# Patient Record
Sex: Male | Born: 1950 | Race: White | Hispanic: No | Marital: Married | Smoking: Never smoker
Health system: Southern US, Community
[De-identification: ages and names within clinical notes are randomized; demographics above are authoritative.]

## PROBLEM LIST (undated history)

## (undated) DIAGNOSIS — Z1211 Encounter for screening for malignant neoplasm of colon: Secondary | ICD-10-CM

## (undated) DIAGNOSIS — C61 Malignant neoplasm of prostate: Secondary | ICD-10-CM

## (undated) DIAGNOSIS — R55 Syncope and collapse: Secondary | ICD-10-CM

## (undated) DIAGNOSIS — R42 Dizziness and giddiness: Secondary | ICD-10-CM

## (undated) DIAGNOSIS — I10 Essential (primary) hypertension: Secondary | ICD-10-CM

## (undated) DIAGNOSIS — N200 Calculus of kidney: Secondary | ICD-10-CM

## (undated) DIAGNOSIS — K449 Diaphragmatic hernia without obstruction or gangrene: Secondary | ICD-10-CM

## (undated) DIAGNOSIS — J310 Chronic rhinitis: Secondary | ICD-10-CM

## (undated) DIAGNOSIS — Z86010 Personal history of colonic polyps: Secondary | ICD-10-CM

## (undated) DIAGNOSIS — K219 Gastro-esophageal reflux disease without esophagitis: Secondary | ICD-10-CM

## (undated) DIAGNOSIS — Z860101 Personal history of adenomatous and serrated colon polyps: Secondary | ICD-10-CM

## (undated) HISTORY — DX: Calculus of kidney: N20.0

## (undated) HISTORY — DX: Syncope and collapse: R55

## (undated) HISTORY — DX: Gastro-esophageal reflux disease without esophagitis: K21.9

---

## 2004-08-27 DIAGNOSIS — J310 Chronic rhinitis: Secondary | ICD-10-CM | POA: Insufficient documentation

## 2009-08-17 DIAGNOSIS — K449 Diaphragmatic hernia without obstruction or gangrene: Secondary | ICD-10-CM | POA: Insufficient documentation

## 2009-09-19 HISTORY — PX: COLONOSCOPY: SHX174

## 2013-06-19 DIAGNOSIS — Z8619 Personal history of other infectious and parasitic diseases: Secondary | ICD-10-CM | POA: Insufficient documentation

## 2014-06-24 DIAGNOSIS — K219 Gastro-esophageal reflux disease without esophagitis: Secondary | ICD-10-CM | POA: Insufficient documentation

## 2014-11-02 ENCOUNTER — Encounter: Payer: Self-pay | Admitting: Radiation Oncology

## 2014-11-02 DIAGNOSIS — C61 Malignant neoplasm of prostate: Secondary | ICD-10-CM | POA: Insufficient documentation

## 2014-11-02 NOTE — Progress Notes (Signed)
Radiation Oncology         [phone]) [phone] ________________________________  Initial outpatient Consultation  Name: Ernest Campos MRN: [id number]  Date: 11/03/2014  DOB: 02/08/1951  CC:No primary care provider on file.  Raynelle Bring, MD   REFERRING PHYSICIAN: Raynelle Bring, MD  DIAGNOSIS: 64 y.o. gentleman with stage T1c adenocarcinoma of the prostate with a Gleason's score of 3+4 and a PSA of 6.72    ICD-9-CM ICD-10-CM   1. Malignant neoplasm of prostate Ernest Campos is a 63 y.o. gentleman.  He was noted to have an elevated PSA of 4.82 on 06/26/14 by his primary care physician, Dr. Lebron Conners in [place].  Accordingly, he was referred for evaluation in urology by Dr. Alinda Money on 09/24/14,  digital rectal examination was performed at that time revealing a 60 gm with no nodules.  Repeat PSA was 6.72.  The patient proceeded to transrectal ultrasound with 12 biopsies of the prostate on 10/14/14.  The prostate volume measured 34 cc.  Out of 12 core biopsies, 5 were positive.  The maximum Gleason score was 3+4, and this was seen in the right lateral base.  The patient reviewed the biopsy results with his urologist and he has kindly been referred today for discussion of potential radiation treatment options.  PREVIOUS RADIATION THERAPY: No  PAST MEDICAL HISTORY:  has a past medical history of Hypertension; Hiatal hernia; Encounter for colonoscopy due to history of adenomatous colonic polyps; Chronic rhinitis; and Prostate cancer.    PAST SURGICAL HISTORY: Past Surgical History  Procedure Laterality Date  . Colonoscopy  2011    FAMILY HISTORY: family history includes Cancer in his mother.  SOCIAL HISTORY:  reports that he has never smoked. He does not have any smokeless tobacco history on file. He reports that he drinks alcohol.  ALLERGIES: Review of patient's allergies indicates no known allergies.  MEDICATIONS:  Current Outpatient  Prescriptions  Medication Sig Dispense Refill  . esomeprazole (NEXIUM) 40 MG capsule Take 40 mg by mouth daily.    . valsartan-hydrochlorothiazide (DIOVAN-HCT) 80-12.5 MG per tablet Take 1 tablet by mouth daily.    . meclizine (ANTIVERT) 25 MG tablet Take 25 mg by mouth every 6 (six) hours as needed.     No current facility-administered medications for this encounter.    REVIEW OF SYSTEMS:  A 15 point review of systems is documented in the electronic medical record. This was obtained by the nursing staff. However, I reviewed this with the patient to discuss relevant findings and make appropriate changes.  A comprehensive review of systems was negative..  The patient completed an IPSS and IIEF questionnaire.  His IPSS score was low indicating mild urinary outflow obstructive symptoms.  He indicated that his erectile function is able to complete sexual activity on most attempts.   PHYSICAL EXAM: This patient is in no acute distress.  He is alert and oriented.   weight is 222 lb 3.2 oz (100.789 kg). His blood pressure is 159/89 and his pulse is 69. His respiration is 16.  He exhibits no respiratory distress or labored breathing.  He appears neurologically intact.  His mood is pleasant.  His affect is appropriate.  Please note the digital rectal exam findings described above.  KPS = 100  100 - Normal; no complaints; no evidence of disease. 90   - Able to carry on normal activity; minor signs or symptoms of disease. 80   - Normal activity with  effort; some signs or symptoms of disease. 12   - Cares for self; unable to carry on normal activity or to do active work. 60   - Requires occasional assistance, but is able to care for most of his personal needs. 50   - Requires considerable assistance and frequent medical care. 16   - Disabled; requires special care and assistance. 19   - Severely disabled; hospital admission is indicated although death not imminent. 64   - Very sick; hospital admission  necessary; active supportive treatment necessary. 10   - Moribund; fatal processes progressing rapidly. 0     - Dead  Karnofsky DA, Abelmann WH, Craver LS and Burchenal JH [phone]) The use of the nitrogen mustards in the palliative treatment of carcinoma: with particular reference to bronchogenic carcinoma Cancer 1 [phone]   LABORATORY DATA:  No results found for: WBC, HGB, HCT, MCV, PLT No results found for: NA, K, CL, CO2 No results found for: ALT, AST, GGT, ALKPHOS, BILITOT   RADIOGRAPHY: No results found.    IMPRESSION: This gentleman is a 64 y.o. gentleman with stage T1c adenocarcinoma of the prostate with a Gleason's score of 3+4 and a PSA of 6.72.  His T-Stage, Gleason's Score, and PSA put him into the intermediate risk group.  Accordingly he is eligible for a variety of potential treatment options including radical prostatectomy, external beam radiotherapy, or he would potentially be a good candidate for prostate seed implant given the low volume of Gleason 7 with primary grade 3.  PLAN:Today I reviewed the findings and workup thus far.  We discussed the natural history of prostate cancer.  We reviewed the the implications of T-stage, Gleason's Score, and PSA on decision-making and outcomes related to prostate cancer.  We discussed radiation treatment in the management of prostate cancer with regard to the logistics and delivery of external beam radiation treatment as well as the logistics and delivery of prostate brachytherapy.  We compared and contrasted each of these approaches and also compared these against prostatectomy.    The patient focused most of his questions and interest in robotic-assisted laparoscopic radical prostatectomy.  We discussed some of the potential advantages of surgery including surgical staging, the availability of salvage radiotherapy to the prostatic fossa, and the confidence associated with immediate biochemical response.  We discussed some of the potential  proven indications for postoperative radiotherapy including positive margins, extracapsular extension, and seminal vesicle involvement. We also talked about some of the other potential findings leading to a recommendation for radiotherapy including a non-zero postoperative PSA and positive lymph nodes.   The patient would like to proceed with prostatectomy. I enjoyed meeting with him today, and will look forward to participating in the care of this very nice gentleman.  I will look forward to following his progress   I spent 60 minutes minutes face to face with the patient and more than 50% of that time was spent in counseling and/or coordination of care.      ------------------------------------------------  Sheral Apley. Tammi Klippel, M.D.

## 2014-11-03 ENCOUNTER — Encounter: Payer: Self-pay | Admitting: Radiation Oncology

## 2014-11-03 ENCOUNTER — Ambulatory Visit
Admission: RE | Admit: 2014-11-03 | Discharge: 2014-11-03 | Disposition: A | Discharge status: Home / Self Care | Payer: BLUE CROSS/BLUE SHIELD | Source: Ambulatory Visit | Attending: Radiation Oncology | Admitting: Radiation Oncology

## 2014-11-03 VITALS — BP 159/89 | HR 69 | Resp 16 | Wt 222.2 lb

## 2014-11-03 DIAGNOSIS — C61 Malignant neoplasm of prostate: Secondary | ICD-10-CM | POA: Diagnosis present

## 2014-11-03 DIAGNOSIS — Z86010 Personal history of colonic polyps: Secondary | ICD-10-CM | POA: Diagnosis not present

## 2014-11-03 DIAGNOSIS — I10 Essential (primary) hypertension: Secondary | ICD-10-CM | POA: Insufficient documentation

## 2014-11-03 DIAGNOSIS — Z79899 Other long term (current) drug therapy: Secondary | ICD-10-CM | POA: Insufficient documentation

## 2014-11-03 HISTORY — DX: Malignant neoplasm of prostate: C61

## 2014-11-03 HISTORY — DX: Essential (primary) hypertension: I10

## 2014-11-03 HISTORY — DX: Encounter for screening for malignant neoplasm of colon: Z12.11

## 2014-11-03 HISTORY — DX: Chronic rhinitis: J31.0

## 2014-11-03 HISTORY — DX: Diaphragmatic hernia without obstruction or gangrene: K44.9

## 2014-11-03 HISTORY — DX: Personal history of colonic polyps: Z86.010

## 2014-11-03 HISTORY — DX: Personal history of adenomatous and serrated colon polyps: Z86.0101

## 2014-11-03 NOTE — Progress Notes (Addendum)
GU Location of Tumor / Histology: prostatic adenocarcinoma  If Prostate Cancer, Gleason Score is (3 + 4) and PSA is (6.72)  Ernest Campos referred to Dr. Alinda Money by Dr. Lebron Conners for elevated PSA.  Biopsies of prostate (if applicable) revealed:    Past/Anticipated interventions by urology, if any: biopsy, referral, discussed prostatectomy  Past/Anticipated interventions by medical oncology, if any: no  Weight changes, if any: no  Bowel/Bladder complaints, if any: reports occasional hematuria since biopsy, describes a strong and steady urine stream, denies difficulty emptying, reports nocturia x 1 (however, he does report that he doesn't wake due to the need to void), denies dysuria, denies urgency.  Nausea/Vomiting, if any: no  Pain issues, if any: long standing intermittent low back pain; reports low back "went out about two weeks ago."  SAFETY ISSUES:  Prior radiation? no  Pacemaker/ICD? Penton  Possible current pregnancy? no  Is the patient on methotrexate? no  Current Complaints / other details:  64 year old male. Married. Prostate volume 34 cc.

## 2014-11-03 NOTE — Progress Notes (Signed)
See progress note under physician encounter. 

## 2014-11-06 ENCOUNTER — Other Ambulatory Visit: Payer: Self-pay | Admitting: Urology

## 2014-11-12 ENCOUNTER — Telehealth: Payer: Self-pay | Admitting: Cardiovascular Disease

## 2014-11-12 NOTE — Telephone Encounter (Signed)
Patient has consult with Dr. Acie Fredrickson for surgical clearance for prostate surgery on March 1. Left voicemail (patient identified voicemail) for patient that we are not able to anticipate and order and schedule tests prior to him seeing Dr. Acie Fredrickson Advised patient to call back with any other concerns.

## 2014-11-12 NOTE — Telephone Encounter (Signed)
New Msg       Pt is a new pt, calling to see what type of test will be completed.   Pt lives very far away and would like to have as much completed in one day as possible.    Please return pt call.

## 2014-11-18 ENCOUNTER — Encounter: Payer: Self-pay | Admitting: Cardiovascular Disease

## 2014-11-18 ENCOUNTER — Ambulatory Visit (INDEPENDENT_AMBULATORY_CARE_PROVIDER_SITE_OTHER): Payer: BLUE CROSS/BLUE SHIELD | Admitting: Cardiovascular Disease

## 2014-11-18 ENCOUNTER — Ambulatory Visit (HOSPITAL_COMMUNITY): Payer: BLUE CROSS/BLUE SHIELD | Attending: Cardiology | Admitting: Radiology

## 2014-11-18 VITALS — BP 132/76 | HR 75 | Ht 70.0 in | Wt 219.0 lb

## 2014-11-18 DIAGNOSIS — I10 Essential (primary) hypertension: Secondary | ICD-10-CM

## 2014-11-18 DIAGNOSIS — R55 Syncope and collapse: Secondary | ICD-10-CM | POA: Insufficient documentation

## 2014-11-18 NOTE — Progress Notes (Signed)
Cardiology Office Note   Date:  11/18/2014   ID:  Daymon Larsen, DOB 27-Jun-1951, MRN [id number]  PCP:  No primary care provider on file.  Cardiologist:   Gerado Nabers, Wonda Cheng, MD   Chief Complaint  Patient presents with  . Hypertension    pre-op evaluatino   Problem list 1. Hypertension 2. History of syncope 3.   History of Present Illness: Ernest Campos is a 64 y.o. male who is referred by Dr. Alinda Money ( primary is Lebron Conners in [place], [place]) presents for pre op evaluation prior to prostate surgery.  He had a prostate Bx in Jan.  He had a syncopal episode aprox 35 minutes after the bx.  He was out for about 20-30 seconds.  He was sitting down at the time.  Passed out.  They went home and felt fine after that.     He was not in any pain - slight discomfort at the bx site.  No additional complications following  No CP, no dyspnea. He walks on occasion.   Has not done much during the winter months. 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    He has had vasovagle episodes in the past.  Several times after giving blood.     Past Medical History  Diagnosis Date  . Hypertension   . Hiatal hernia   . Encounter for colonoscopy due to history of adenomatous colonic polyps   . Chronic rhinitis   . Prostate cancer   . Syncopal episodes   . Esophageal reflux   . Renal calculi     Past Surgical History  Procedure Laterality Date  . Colonoscopy  2011     Current Outpatient Prescriptions  Medication Sig Dispense Refill  . esomeprazole (NEXIUM) 40 MG capsule Take 40 mg by mouth daily.    . valsartan-hydrochlorothiazide (DIOVAN-HCT) 80-12.5 MG per tablet Take 1 tablet by mouth daily.     No current facility-administered medications for this visit.    Allergies:   Review of patient's allergies indicates no known allergies.    Social History:  The patient  reports that he has never smoked. He does not have any smokeless tobacco history on file. He reports that he drinks  alcohol.   Family History:  The patient's family history includes Cancer in his father; Hypertension in his father. There is no history of Heart attack or Stroke.    ROS:  Please see the history of present illness.    Review of Systems: Constitutional:  denies fever, chills, diaphoresis, appetite change and fatigue.  HEENT: denies photophobia, eye pain, redness, hearing loss, ear pain, congestion, sore throat, rhinorrhea, sneezing, neck pain, neck stiffness and tinnitus.  Respiratory: denies SOB, DOE, cough, chest tightness, and wheezing.  Cardiovascular: denies chest pain, palpitations and leg swelling.  Gastrointestinal: denies nausea, vomiting, abdominal pain, diarrhea, constipation, blood in stool.  Genitourinary: denies dysuria, urgency, frequency, hematuria, flank pain and difficulty urinating.  Musculoskeletal: denies  myalgias, back pain, joint swelling, arthralgias and gait problem.   Skin: denies pallor, rash and wound.  Neurological: denies dizziness, seizures, syncope, weakness, light-headedness, numbness and headaches.   Hematological: denies adenopathy, easy bruising, personal or family bleeding history.  Psychiatric/ Behavioral: denies suicidal ideation, mood changes, confusion, nervousness, sleep disturbance and agitation.       All other systems are reviewed and negative.    PHYSICAL EXAM: VS:  BP 132/76 mmHg  Pulse 75  Ht 5\' 10"  (1.778 m)  Wt 219 lb (99.338 kg)  BMI 31.42 kg/m2 , BMI Body mass index is 31.42 kg/(m^2). GEN: Well nourished, well developed, in no acute distress HEENT: normal Neck: no JVD, carotid bruits, or masses Cardiac: RRR; no murmurs, rubs, or gallops,no edema  Respiratory:  clear to auscultation bilaterally, normal work of breathing GI: soft, nontender, nondistended, + BS MS: no deformity or atrophy Skin: warm and dry, no rash Neuro:  Strength and sensation are intact Psych: normal   EKG:  EKG is ordered today. The ekg ordered today  demonstrates NSR at 75. Sinus arrhythmia.  Normal eCG    Recent Labs: No results found for requested labs within last 365 days.    Lipid Panel No results found for: CHOL, TRIG, HDL, CHOLHDL, VLDL, LDLCALC, LDLDIRECT    Wt Readings from Last 3 Encounters:  11/18/14 219 lb (99.338 kg)      Other studies Reviewed: Additional studies/ records that were reviewed today include: . Review of the above records demonstrates:    ASSESSMENT AND PLAN:  1.  Syncope:  He admits to having vasovagal episodes in the past. Every one of these episodes has occurred during this time when he was on depleted.   This last episode occurred 35 minutes after his prostate biopsy but he had been nothing by mouth since midnight the night before.  I do not think that it is sounds like ischemic heart disease. He's able to exercise without any significant problems. In addition, he typically does not have any Issues when he eats and drink normally.  We will get an echocardiogram for further evaluation of his cardiac function. I've encouraged him to stay hydrated. I would treat him aggressively with IV fluids during his prostate surgery.  I'll see him on an as-needed basis.  2. Hypertension: Continue same medications. He will follow-up with his general medical doctor.   Current medicines are reviewed at length with the patient today.  The patient does not have concerns regarding medicines.  The following changes have been made:  no change   Disposition:   FU with me as needed.    Signed, Zamira Hickam, Wonda Cheng, MD  11/18/2014 8:38 AM    [facility] [place], [place], [place]  [address] Phone: [phone]; Fax: [phone]

## 2014-11-18 NOTE — Patient Instructions (Signed)
Your physician recommends that you continue on your current medications as directed. Please refer to the Current Medication list given to you today.  Your physician has requested that you have an echocardiogram. Echocardiography is a painless test that uses sound waves to create images of your heart. It provides your doctor with information about the size and shape of your heart and how well your heart's chambers and valves are working. This procedure takes approximately one hour. There are no restrictions for this procedure.  Your physician recommends that you schedule a follow-up appointment in: as needed with Dr. Acie Fredrickson

## 2014-11-18 NOTE — Progress Notes (Signed)
Echocardiogram performed.  

## 2014-11-19 ENCOUNTER — Telehealth: Payer: Self-pay | Admitting: Cardiovascular Disease

## 2014-11-19 NOTE — Telephone Encounter (Signed)
Reviewed results of echocardiogram with patient who verbalized understanding and thanked me for the great care he received from everyone at [facility].

## 2014-11-19 NOTE — Telephone Encounter (Signed)
New message ° ° ° ° °Returning Michelle's call °

## 2014-12-02 ENCOUNTER — Ambulatory Visit (HOSPITAL_COMMUNITY)
Admission: RE | Admit: 2014-12-02 | Discharge: 2014-12-02 | Disposition: A | Discharge status: Home / Self Care | Payer: BLUE CROSS/BLUE SHIELD | Source: Ambulatory Visit | Attending: Urology | Admitting: Urology

## 2014-12-02 ENCOUNTER — Encounter (HOSPITAL_COMMUNITY)
Admission: RE | Admit: 2014-12-02 | Discharge: 2014-12-02 | Disposition: A | Discharge status: Home / Self Care | Payer: BLUE CROSS/BLUE SHIELD | Source: Ambulatory Visit | Attending: Urology | Admitting: Urology

## 2014-12-02 ENCOUNTER — Encounter (HOSPITAL_COMMUNITY): Payer: Self-pay

## 2014-12-02 DIAGNOSIS — Z01818 Encounter for other preprocedural examination: Secondary | ICD-10-CM | POA: Insufficient documentation

## 2014-12-02 DIAGNOSIS — C61 Malignant neoplasm of prostate: Secondary | ICD-10-CM | POA: Diagnosis not present

## 2014-12-02 DIAGNOSIS — Z01810 Encounter for preprocedural cardiovascular examination: Secondary | ICD-10-CM

## 2014-12-02 HISTORY — DX: Syncope and collapse: R55

## 2014-12-02 HISTORY — DX: Dizziness and giddiness: R42

## 2014-12-02 LAB — BASIC METABOLIC PANEL
ANION GAP: 4 — AB (ref 5–15)
BUN: 16 mg/dL (ref 6–23)
CALCIUM: 10.2 mg/dL (ref 8.4–10.5)
CO2: 32 mmol/L (ref 19–32)
Chloride: 105 mmol/L (ref 96–112)
Creatinine, Ser: 1.17 mg/dL (ref 0.50–1.35)
GFR calc Af Amer: 76 mL/min — ABNORMAL LOW (ref >90)
GFR calc non Af Amer: 66 mL/min — ABNORMAL LOW (ref >90)
Glucose, Bld: 84 mg/dL (ref 70–99)
Potassium: 4.1 mmol/L (ref 3.5–5.1)
SODIUM: 141 mmol/L (ref 135–145)

## 2014-12-02 LAB — CBC
HCT: 44.4 % (ref 39.0–52.0)
Hemoglobin: 14.7 g/dL (ref 13.0–17.0)
MCH: 27.5 pg (ref 26.0–34.0)
MCHC: 33.1 g/dL (ref 30.0–36.0)
MCV: 83.1 fL (ref 78.0–100.0)
PLATELETS: 173 10*3/uL (ref 150–400)
RBC: 5.34 MIL/uL (ref 4.22–5.81)
RDW: 13.8 % (ref 11.5–15.5)
WBC: 6.9 10*3/uL (ref 4.0–10.5)

## 2014-12-02 NOTE — Patient Instructions (Addendum)
[place]  12/02/2014   Your procedure is scheduled on:     Monday December 08, 2014   Report to [facility] Main Entrance and follow signs to  [place] arrive 9:00 at AM.   Call this number if you have problems the morning of surgery [phone] or Presurgical Testing [phone].   Remember:  Do not eat food or drink liquids :After Midnight.              Remember: follow any bowel prep instructions per MD office.   Take these medicines the morning of surgery with A SIP OF WATER: Nexium                                You may not have any metal on your body including hair pins and piercings  Do not wear jewelry, colognes, lotions, powders, or deodorant.  Men may shave face and neck.               Do not bring valuables to the hospital. [place].  Contacts, dentures or bridgework may not be worn into surgery.  Leave suitcase in the car. After surgery it may be brought to your room.  For patients admitted to the hospital, checkout time is 11:00 AM the day of discharge.   Special Instructions: review fact sheets for  Blood Transfusion fact sheet, Incentive Spirometry.  Remember: Type/Screen "Blue armsbands"- may not be removed once applied(would result in being retested AM of surgery, if removed). ________________________________________________________________________  [facility] - Preparing for Surgery Before surgery, you can play an important role.  Because skin is not sterile, your skin needs to be as free of germs as possible.  You can reduce the number of germs on your skin by washing with CHG (chlorahexidine gluconate) soap before surgery.  CHG is an antiseptic cleaner which kills germs and bonds with the skin to continue killing germs even after washing. Please DO NOT use if you have an allergy to CHG or antibacterial soaps.  If your skin becomes reddened/irritated stop using the CHG and inform your nurse when you arrive  at Short Stay. Do not shave (including legs and underarms) for at least 48 hours prior to the first CHG shower.  You may shave your face/neck. Please follow these instructions carefully:  1.  Shower with CHG Soap the night before surgery and the  morning of Surgery.  2.  If you choose to wash your hair, wash your hair first as usual with your  normal  shampoo.  3.  After you shampoo, rinse your hair and body thoroughly to remove the  shampoo.                           4.  Use CHG as you would any other liquid soap.  You can apply chg directly  to the skin and wash                       Gently with a scrungie or clean washcloth.  5.  Apply the CHG Soap to your body ONLY FROM THE NECK DOWN.   Do not use on face/ open                           Wound or  open sores. Avoid contact with eyes, ears mouth and genitals (private parts).                       Wash face,  Genitals (private parts) with your normal soap.             6.  Wash thoroughly, paying special attention to the area where your surgery  will be performed.  7.  Thoroughly rinse your body with warm water from the neck down.  8.  DO NOT shower/wash with your normal soap after using and rinsing off  the CHG Soap.                9.  Pat yourself dry with a clean towel.            10.  Wear clean pajamas.            11.  Place clean sheets on your bed the night of your first shower and do not  sleep with pets. Day of Surgery : Do not apply any lotions/deodorants the morning of surgery.  Please wear clean clothes to the hospital/surgery center.  FAILURE TO FOLLOW THESE INSTRUCTIONS MAY RESULT IN THE CANCELLATION OF YOUR SURGERY PATIENT SIGNATURE_________________________________  NURSE SIGNATURE__________________________________  ________________________________________________________________________   Adam Phenix  An incentive spirometer is a tool that can help keep your lungs clear and active. This tool measures how well you  are filling your lungs with each breath. Taking long deep breaths may help reverse or decrease the chance of developing breathing (pulmonary) problems (especially infection) following:  A long period of time when you are unable to move or be active. BEFORE THE PROCEDURE   If the spirometer includes an indicator to show your best effort, your nurse or respiratory therapist will set it to a desired goal.  If possible, sit up straight or lean slightly forward. Try not to slouch.  Hold the incentive spirometer in an upright position. INSTRUCTIONS FOR USE   Sit on the edge of your bed if possible, or sit up as far as you can in bed or on a chair.  Hold the incentive spirometer in an upright position.  Breathe out normally.  Place the mouthpiece in your mouth and seal your lips tightly around it.  Breathe in slowly and as deeply as possible, raising the piston or the ball toward the top of the column.  Hold your breath for 3-5 seconds or for as long as possible. Allow the piston or ball to fall to the bottom of the column.  Remove the mouthpiece from your mouth and breathe out normally.  Rest for a few seconds and repeat Steps 1 through 7 at least 10 times every 1-2 hours when you are awake. Take your time and take a few normal breaths between deep breaths.  The spirometer may include an indicator to show your best effort. Use the indicator as a goal to work toward during each repetition.  After each set of 10 deep breaths, practice coughing to be sure your lungs are clear. If you have an incision (the cut made at the time of surgery), support your incision when coughing by placing a pillow or rolled up towels firmly against it. Once you are able to get out of bed, walk around indoors and cough well. You may stop using the incentive spirometer when instructed by your caregiver.  RISKS AND COMPLICATIONS  Take your time so you do not get dizzy or  light-headed.  If you are in pain, you may  need to take or ask for pain medication before doing incentive spirometry. It is harder to take a deep breath if you are having pain. AFTER USE  Rest and breathe slowly and easily.  It can be helpful to keep track of a log of your progress. Your caregiver can provide you with a simple table to help with this. If you are using the spirometer at home, follow these instructions: [place] IF:   You are having difficultly using the spirometer.  You have trouble using the spirometer as often as instructed.  Your pain medication is not giving enough relief while using the spirometer.  You develop fever of 100.5 F (38.1 C) or higher. SEEK IMMEDIATE MEDICAL CARE IF:   You cough up bloody sputum that had not been present before.  You develop fever of 102 F (38.9 C) or greater.  You develop worsening pain at or near the incision site. MAKE SURE YOU:   Understand these instructions.  Will watch your condition.  Will get help right away if you are not doing well or get worse. Document Released: 01/16/2007 Document Revised: 11/28/2011 Document Reviewed: 03/19/2007 ExitCare Patient Information 2014 ExitCare, [place].   ________________________________________________________________________  WHAT IS A BLOOD TRANSFUSION? Blood Transfusion Information  A transfusion is the replacement of blood or some of its parts. Blood is made up of multiple cells which provide different functions.  Red blood cells carry oxygen and are used for blood loss replacement.  White blood cells fight against infection.  Platelets control bleeding.  Plasma helps clot blood.  Other blood products are available for specialized needs, such as hemophilia or other clotting disorders. BEFORE THE TRANSFUSION  Who gives blood for transfusions?   Healthy volunteers who are fully evaluated to make sure their blood is safe. This is blood bank blood. Transfusion therapy is the safest it has ever been in  the practice of medicine. Before blood is taken from a donor, a complete history is taken to make sure that person has no history of diseases nor engages in risky social behavior (examples are intravenous drug use or sexual activity with multiple partners). The donor's travel history is screened to minimize risk of transmitting infections, such as malaria. The donated blood is tested for signs of infectious diseases, such as HIV and hepatitis. The blood is then tested to be sure it is compatible with you in order to minimize the chance of a transfusion reaction. If you or a relative donates blood, this is often done in anticipation of surgery and is not appropriate for emergency situations. It takes many days to process the donated blood. RISKS AND COMPLICATIONS Although transfusion therapy is very safe and saves many lives, the main dangers of transfusion include:   Getting an infectious disease.  Developing a transfusion reaction. This is an allergic reaction to something in the blood you were given. Every precaution is taken to prevent this. The decision to have a blood transfusion has been considered carefully by your caregiver before blood is given. Blood is not given unless the benefits outweigh the risks. AFTER THE TRANSFUSION  Right after receiving a blood transfusion, you will usually feel much better and more energetic. This is especially true if your red blood cells have gotten low (anemic). The transfusion raises the level of the red blood cells which carry oxygen, and this usually causes an energy increase.  The nurse administering the transfusion will monitor you  carefully for complications. HOME CARE INSTRUCTIONS  No special instructions are needed after a transfusion. You may find your energy is better. Speak with your caregiver about any limitations on activity for underlying diseases you may have. SEEK MEDICAL CARE IF:   Your condition is not improving after your  transfusion.  You develop redness or irritation at the intravenous (IV) site. SEEK IMMEDIATE MEDICAL CARE IF:  Any of the following symptoms occur over the next 12 hours:  Shaking chills.  You have a temperature by mouth above 102 F (38.9 C), not controlled by medicine.  Chest, back, or muscle pain.  People around you feel you are not acting correctly or are confused.  Shortness of breath or difficulty breathing.  Dizziness and fainting.  You get a rash or develop hives.  You have a decrease in urine output.  Your urine turns a dark color or changes to pink, red, or brown. Any of the following symptoms occur over the next 10 days:  You have a temperature by mouth above 102 F (38.9 C), not controlled by medicine.  Shortness of breath.  Weakness after normal activity.  The white part of the eye turns yellow (jaundice).  You have a decrease in the amount of urine or are urinating less often.  Your urine turns a dark color or changes to pink, red, or brown. Document Released: 09/02/2000 Document Revised: 11/28/2011 Document Reviewed: 04/21/2008 [facility] Patient Information 2014 [place], [place].  _______________________________________________________________________

## 2014-12-02 NOTE — Progress Notes (Signed)
EKG and ECHO per epic 11/18/2014 LOV note per Dr Acie Fredrickson in epic 11/18/2014

## 2014-12-03 LAB — ABO/RH: ABO/RH(D): O POS

## 2014-12-03 NOTE — Progress Notes (Signed)
BMP results in epic per PAT visit 12/02/2014 sent to Dr Alinda Money

## 2014-12-05 NOTE — H&P (Signed)
  History of Present Illness Ernest Campos is a 65 year old gentleman who was noted to have an elevated PSA of 6.72 prompting a prostate needle biopsy on 10/14/14. This confirmed Gleason 3+4=7 adenocarcinoma of the prostate with 5 out of 12 biopsy cores positive for malignancy. He has a paternal family history of prostate cancer. His only comorbidity is hypertension.  TNM stage: cT1c Nx Mx PSA: 6.72 Gleason score: 3+4=7 Biopsy (10/14/14): 5/12 cores positive   Left: No malignancy   Right: R apex (2/2 cores - 50% and 40%, 3+3=6), R mid (50%, 3+3=6), R lateral mid (60%, 3+3=6), R lateral base (5%, 3+4=7) Prostate volume: 34.0 cc  Nomogram OC disease: 39% EPE: 59% SVI: 4% LNI: 3% PFS (surgery): 85% at 5 years, 75% at 10 years  Urinary function: He has minimal LUTS. IPSS is 4. Erectile function: He denies erectile dysfunction. SHIM score is 25.     Past Medical History Problems  1. History of colon polyps (Z86.010) 2. History of esophageal reflux (Z87.19) 3. History of hiatal hernia (Z87.19) 4. History of hypertension (Z86.79) 5. History of renal calculi (D63.875)  Surgical History Problems  1. History of No Surgical Problems  Current Meds 1. Diovan HCT 80-12.5 MG Oral Tablet;  Therapy: (Recorded:31Dec2015) to Recorded 2. Fluzone INJ;  Therapy: (Recorded:31Dec2015) to Recorded 3. Levofloxacin 500 MG Oral Tablet; 1 tablet the day before the procedure, 1 tablet the day of  the procedure, and 1 tablet the day after the procedure;  Therapy: [id number] to (Last Rx:07Jan2016)  Requested for: [id number] Ordered 4. NexIUM 40 MG Oral Capsule Delayed Release;  Therapy: (Recorded:31Dec2015) to Recorded  Allergies Medication  1. No Known Drug Allergies  Family History Problems  1. Family history of prostate cancer (Z80.42) : Father  Social History Problems  1. Denied: History of Alcohol use 2. Father deceased   71 age 81 yo from cancer 3. Married 4. Mother deceased   57 age 36  yo natural causes 5. Never a smoker 6. Occupation   Publishing copy   Physical Exam Constitutional: Well nourished and well developed . No acute distress.   ENT:. The ears and nose are normal in appearance.   Neck: The appearance of the neck is normal and no neck mass is present.   Pulmonary: No respiratory distress and normal respiratory rhythm and effort.   Cardiovascular: Heart rate and rhythm are normal . No peripheral edema.   Neuro/Psych:. Mood and affect are appropriate.    Assessment Assessed  1. Prostate cancer (C61)    Discussion/Summary 1. Prostate cancer: He has elected to proceed with a bilateral nerve sparing robotic-assisted laparoscopic radical prostatectomy and pelvic lymphadenectomy.

## 2014-12-08 ENCOUNTER — Inpatient Hospital Stay (HOSPITAL_COMMUNITY): Payer: BLUE CROSS/BLUE SHIELD | Admitting: Certified Registered Nurse Anesthetist

## 2014-12-08 ENCOUNTER — Inpatient Hospital Stay (HOSPITAL_COMMUNITY)
Admission: RE | Admit: 2014-12-08 | Discharge: 2014-12-09 | DRG: 708 | Disposition: A | Discharge status: Home / Self Care | Payer: BLUE CROSS/BLUE SHIELD | Source: Ambulatory Visit | Attending: Urology | Admitting: Urology

## 2014-12-08 ENCOUNTER — Encounter (HOSPITAL_COMMUNITY)
Admission: RE | Disposition: A | Discharge status: Home / Self Care | Payer: Self-pay | Source: Ambulatory Visit | Attending: Urology

## 2014-12-08 ENCOUNTER — Encounter (HOSPITAL_COMMUNITY): Payer: Self-pay | Admitting: *Deleted

## 2014-12-08 DIAGNOSIS — Z79899 Other long term (current) drug therapy: Secondary | ICD-10-CM

## 2014-12-08 DIAGNOSIS — K219 Gastro-esophageal reflux disease without esophagitis: Secondary | ICD-10-CM | POA: Diagnosis present

## 2014-12-08 DIAGNOSIS — C61 Malignant neoplasm of prostate: Principal | ICD-10-CM | POA: Diagnosis present

## 2014-12-08 DIAGNOSIS — I10 Essential (primary) hypertension: Secondary | ICD-10-CM | POA: Diagnosis present

## 2014-12-08 DIAGNOSIS — Z87442 Personal history of urinary calculi: Secondary | ICD-10-CM | POA: Diagnosis not present

## 2014-12-08 DIAGNOSIS — Z8042 Family history of malignant neoplasm of prostate: Secondary | ICD-10-CM

## 2014-12-08 DIAGNOSIS — Z86010 Personal history of colonic polyps: Secondary | ICD-10-CM | POA: Diagnosis not present

## 2014-12-08 DIAGNOSIS — K449 Diaphragmatic hernia without obstruction or gangrene: Secondary | ICD-10-CM | POA: Diagnosis present

## 2014-12-08 HISTORY — PX: ROBOT ASSISTED LAPAROSCOPIC RADICAL PROSTATECTOMY: SHX5141

## 2014-12-08 HISTORY — PX: LYMPHADENECTOMY: SHX5960

## 2014-12-08 LAB — TYPE AND SCREEN
ABO/RH(D): O POS
Antibody Screen: NEGATIVE

## 2014-12-08 LAB — HEMOGLOBIN AND HEMATOCRIT, BLOOD
HCT: 40.4 % (ref 39.0–52.0)
Hemoglobin: 13.4 g/dL (ref 13.0–17.0)

## 2014-12-08 SURGERY — ROBOTIC ASSISTED LAPAROSCOPIC RADICAL PROSTATECTOMY LEVEL 2
Anesthesia: General

## 2014-12-08 MED ORDER — FENTANYL CITRATE 0.05 MG/ML IJ SOLN
INTRAMUSCULAR | Status: AC
  Filled 2014-12-08: qty 5

## 2014-12-08 MED ORDER — DIPHENHYDRAMINE HCL 12.5 MG/5ML PO ELIX
12.5000 mg | ORAL_SOLUTION | Freq: Four times a day (QID) | ORAL | Status: DC | PRN
Start: 2014-12-08 — End: 2014-12-09

## 2014-12-08 MED ORDER — MORPHINE SULFATE 2 MG/ML IJ SOLN: 2.0000 mg | INTRAMUSCULAR | Status: DC | PRN

## 2014-12-08 MED ORDER — CISATRACURIUM BESYLATE (PF) 10 MG/5ML IV SOLN
INTRAVENOUS | Status: DC | PRN
  Administered 2014-12-08: 6 mg via INTRAVENOUS
  Administered 2014-12-08: 10 mg via INTRAVENOUS

## 2014-12-08 MED ORDER — MIDAZOLAM HCL 2 MG/2ML IJ SOLN
INTRAMUSCULAR | Status: AC
  Filled 2014-12-08: qty 2

## 2014-12-08 MED ORDER — LIDOCAINE HCL (CARDIAC) 20 MG/ML IV SOLN
INTRAVENOUS | Status: AC
  Filled 2014-12-08: qty 5

## 2014-12-08 MED ORDER — ACETAMINOPHEN 325 MG PO TABS: 650.0000 mg | ORAL_TABLET | ORAL | Status: DC | PRN

## 2014-12-08 MED ORDER — MEPERIDINE HCL 50 MG/ML IJ SOLN
INTRAMUSCULAR | Status: AC
  Administered 2014-12-08: 17:00:00
  Filled 2014-12-08: qty 1

## 2014-12-08 MED ORDER — PANTOPRAZOLE SODIUM 40 MG PO TBEC
80.0000 mg | DELAYED_RELEASE_TABLET | Freq: Every day | ORAL | Status: DC
  Administered 2014-12-09: 80 mg via ORAL
  Filled 2014-12-08 (×2): qty 2

## 2014-12-08 MED ORDER — MIDAZOLAM HCL 5 MG/5ML IJ SOLN
INTRAMUSCULAR | Status: DC | PRN
  Administered 2014-12-08: 2 mg via INTRAVENOUS

## 2014-12-08 MED ORDER — CISATRACURIUM BESYLATE 20 MG/10ML IV SOLN
INTRAVENOUS | Status: AC
  Filled 2014-12-08: qty 10

## 2014-12-08 MED ORDER — NEOSTIGMINE METHYLSULFATE 10 MG/10ML IV SOLN
INTRAVENOUS | Status: DC | PRN
  Administered 2014-12-08: 3 mg via INTRAVENOUS

## 2014-12-08 MED ORDER — HEPARIN SODIUM (PORCINE) 1000 UNIT/ML IJ SOLN
INTRAMUSCULAR | Status: DC | PRN
Start: 2014-12-08 — End: 2014-12-08
  Administered 2014-12-08: 1000 mL

## 2014-12-08 MED ORDER — CIPROFLOXACIN HCL 500 MG PO TABS: 500.0000 mg | ORAL_TABLET | Freq: Two times a day (BID) | ORAL | Status: AC

## 2014-12-08 MED ORDER — CEFAZOLIN SODIUM-DEXTROSE 2-3 GM-% IV SOLR
2.0000 g | INTRAVENOUS | Status: AC
  Administered 2014-12-08: 2 g via INTRAVENOUS

## 2014-12-08 MED ORDER — FENTANYL CITRATE 0.05 MG/ML IJ SOLN
INTRAMUSCULAR | Status: DC | PRN
  Administered 2014-12-08: 100 ug via INTRAVENOUS

## 2014-12-08 MED ORDER — SODIUM CHLORIDE 0.9 % IV BOLUS (SEPSIS)
1000.0000 mL | Freq: Once | INTRAVENOUS | Status: AC
  Administered 2014-12-08: 1000 mL via INTRAVENOUS

## 2014-12-08 MED ORDER — GLYCOPYRROLATE 0.2 MG/ML IJ SOLN
INTRAMUSCULAR | Status: AC
  Filled 2014-12-08: qty 2

## 2014-12-08 MED ORDER — BUPIVACAINE-EPINEPHRINE (PF) 0.25% -1:200000 IJ SOLN
INTRAMUSCULAR | Status: AC
  Filled 2014-12-08: qty 30

## 2014-12-08 MED ORDER — HYDROMORPHONE HCL 1 MG/ML IJ SOLN: 0.2500 mg | INTRAMUSCULAR | Status: DC | PRN

## 2014-12-08 MED ORDER — SODIUM CHLORIDE 0.9 % IR SOLN
Status: DC | PRN
  Administered 2014-12-08: 300 mL via INTRAVESICAL

## 2014-12-08 MED ORDER — KETOROLAC TROMETHAMINE 15 MG/ML IJ SOLN
15.0000 mg | Freq: Four times a day (QID) | INTRAMUSCULAR | Status: DC
  Administered 2014-12-08 – 2014-12-09 (×4): 15 mg via INTRAVENOUS
  Filled 2014-12-08 (×6): qty 1

## 2014-12-08 MED ORDER — KCL IN DEXTROSE-NACL 20-5-0.45 MEQ/L-%-% IV SOLN
INTRAVENOUS | Status: AC
  Administered 2014-12-08: 1000 mL
  Filled 2014-12-08: qty 1000

## 2014-12-08 MED ORDER — HYDROCODONE-ACETAMINOPHEN 5-325 MG PO TABS: 1.0000 | ORAL_TABLET | Freq: Four times a day (QID) | ORAL | Status: AC | PRN

## 2014-12-08 MED ORDER — CEFAZOLIN SODIUM-DEXTROSE 2-3 GM-% IV SOLR
INTRAVENOUS | Status: AC
  Filled 2014-12-08: qty 50

## 2014-12-08 MED ORDER — STERILE WATER FOR IRRIGATION IR SOLN
Status: DC | PRN
  Administered 2014-12-08: 3000 mL

## 2014-12-08 MED ORDER — ONDANSETRON HCL 4 MG/2ML IJ SOLN
INTRAMUSCULAR | Status: AC
  Filled 2014-12-08: qty 2

## 2014-12-08 MED ORDER — LACTATED RINGERS IV SOLN
INTRAVENOUS | Status: DC | PRN
  Administered 2014-12-08 (×2): via INTRAVENOUS

## 2014-12-08 MED ORDER — GLYCOPYRROLATE 0.2 MG/ML IJ SOLN
INTRAMUSCULAR | Status: DC | PRN
  Administered 2014-12-08 (×2): 0.2 mg via INTRAVENOUS
  Administered 2014-12-08: 0.4 mg via INTRAVENOUS

## 2014-12-08 MED ORDER — CEFAZOLIN SODIUM 1-5 GM-% IV SOLN
1.0000 g | Freq: Three times a day (TID) | INTRAVENOUS | Status: AC
  Administered 2014-12-08 – 2014-12-09 (×2): 1 g via INTRAVENOUS
  Filled 2014-12-08 (×2): qty 50

## 2014-12-08 MED ORDER — HYDROMORPHONE HCL 1 MG/ML IJ SOLN
INTRAMUSCULAR | Status: DC | PRN
  Administered 2014-12-08 (×2): 0.5 mg via INTRAVENOUS
  Administered 2014-12-08: 1 mg via INTRAVENOUS

## 2014-12-08 MED ORDER — HYDROMORPHONE HCL 2 MG/ML IJ SOLN
INTRAMUSCULAR | Status: AC
  Filled 2014-12-08: qty 1

## 2014-12-08 MED ORDER — PROPOFOL 10 MG/ML IV BOLUS
INTRAVENOUS | Status: DC | PRN
  Administered 2014-12-08: 1 mg via INTRAVENOUS

## 2014-12-08 MED ORDER — HEPARIN SODIUM (PORCINE) 1000 UNIT/ML IJ SOLN
INTRAMUSCULAR | Status: AC
  Filled 2014-12-08: qty 1

## 2014-12-08 MED ORDER — ATROPINE SULFATE 0.4 MG/ML IJ SOLN
INTRAMUSCULAR | Status: DC | PRN
  Administered 2014-12-08: 0.4 mg via INTRAVENOUS

## 2014-12-08 MED ORDER — LACTATED RINGERS IV SOLN: INTRAVENOUS | Status: DC

## 2014-12-08 MED ORDER — LACTATED RINGERS IV SOLN
INTRAVENOUS | Status: DC
  Administered 2014-12-08: 1000 mL via INTRAVENOUS

## 2014-12-08 MED ORDER — BUPIVACAINE-EPINEPHRINE 0.25% -1:200000 IJ SOLN
INTRAMUSCULAR | Status: DC | PRN
  Administered 2014-12-08: 30 mL

## 2014-12-08 MED ORDER — KCL IN DEXTROSE-NACL 20-5-0.45 MEQ/L-%-% IV SOLN
INTRAVENOUS | Status: DC
  Administered 2014-12-08 – 2014-12-09 (×4): via INTRAVENOUS
  Filled 2014-12-08 (×5): qty 1000

## 2014-12-08 MED ORDER — NEOSTIGMINE METHYLSULFATE 10 MG/10ML IV SOLN
INTRAVENOUS | Status: AC
  Filled 2014-12-08: qty 1

## 2014-12-08 MED ORDER — ONDANSETRON HCL 4 MG/2ML IJ SOLN
INTRAMUSCULAR | Status: DC | PRN
  Administered 2014-12-08: 4 mg via INTRAVENOUS

## 2014-12-08 MED ORDER — GLYCOPYRROLATE 0.2 MG/ML IJ SOLN
INTRAMUSCULAR | Status: AC
  Filled 2014-12-08: qty 1

## 2014-12-08 MED ORDER — LIDOCAINE HCL 1 % IJ SOLN
INTRAMUSCULAR | Status: DC | PRN
  Administered 2014-12-08: 80 mg via INTRADERMAL

## 2014-12-08 MED ORDER — DOCUSATE SODIUM 100 MG PO CAPS
100.0000 mg | ORAL_CAPSULE | Freq: Two times a day (BID) | ORAL | Status: DC
  Administered 2014-12-08 – 2014-12-09 (×2): 100 mg via ORAL
  Filled 2014-12-08 (×3): qty 1

## 2014-12-08 MED ORDER — SUCCINYLCHOLINE CHLORIDE 20 MG/ML IJ SOLN
INTRAMUSCULAR | Status: DC | PRN
  Administered 2014-12-08: 100 mg via INTRAVENOUS

## 2014-12-08 MED ORDER — PROPOFOL 10 MG/ML IV BOLUS
INTRAVENOUS | Status: AC
  Filled 2014-12-08: qty 20

## 2014-12-08 MED ORDER — DIPHENHYDRAMINE HCL 50 MG/ML IJ SOLN: 12.5000 mg | Freq: Four times a day (QID) | INTRAMUSCULAR | Status: DC | PRN

## 2014-12-08 MED ORDER — MEPERIDINE HCL 50 MG/ML IJ SOLN
6.2500 mg | INTRAMUSCULAR | Status: DC | PRN
  Administered 2014-12-08: 12.5 mg via INTRAVENOUS

## 2014-12-08 SURGICAL SUPPLY — 51 items
CABLE HIGH FREQUENCY MONO STRZ (ELECTRODE) ×4 IMPLANT
CATH FOLEY 2WAY SLVR 18FR 30CC (CATHETERS) ×4 IMPLANT
CATH ROBINSON RED A/P 16FR (CATHETERS) ×4 IMPLANT
CATH ROBINSON RED A/P 8FR (CATHETERS) ×4 IMPLANT
CATH TIEMANN FOLEY 18FR 5CC (CATHETERS) ×4 IMPLANT
CHLORAPREP W/TINT 26ML (MISCELLANEOUS) ×4 IMPLANT
CLIP LIGATING HEM O LOK PURPLE (MISCELLANEOUS) ×8 IMPLANT
CLOTH BEACON ORANGE TIMEOUT ST (SAFETY) ×4 IMPLANT
COVER SURGICAL LIGHT HANDLE (MISCELLANEOUS) ×4 IMPLANT
COVER TIP SHEARS 8 DVNC (MISCELLANEOUS) ×2 IMPLANT
COVER TIP SHEARS 8MM DA VINCI (MISCELLANEOUS) ×2
CUTTER ECHEON FLEX ENDO 45 340 (ENDOMECHANICALS) ×4 IMPLANT
DECANTER SPIKE VIAL GLASS SM (MISCELLANEOUS) IMPLANT
DRAPE SURG IRRIG POUCH 19X23 (DRAPES) ×4 IMPLANT
DRSG TEGADERM 4X4.75 (GAUZE/BANDAGES/DRESSINGS) ×4 IMPLANT
DRSG TEGADERM 6X8 (GAUZE/BANDAGES/DRESSINGS) ×8 IMPLANT
ELECT REM PT RETURN 9FT ADLT (ELECTROSURGICAL) ×4
ELECTRODE REM PT RTRN 9FT ADLT (ELECTROSURGICAL) ×2 IMPLANT
GLOVE BIO SURGEON STRL SZ 6.5 (GLOVE) ×3 IMPLANT
GLOVE BIO SURGEONS STRL SZ 6.5 (GLOVE) ×1
GLOVE BIOGEL M STRL SZ7.5 (GLOVE) ×8 IMPLANT
GOWN STRL REUS W/TWL LRG LVL3 (GOWN DISPOSABLE) ×12 IMPLANT
HEMOSTAT SNOW SURGICEL 2X4 (HEMOSTASIS) ×4 IMPLANT
HOLDER FOLEY CATH W/STRAP (MISCELLANEOUS) ×4 IMPLANT
IV LACTATED RINGERS 1000ML (IV SOLUTION) IMPLANT
KIT ACCESSORY DA VINCI DISP (KITS) ×2
KIT ACCESSORY DVNC DISP (KITS) ×2 IMPLANT
LIQUID BAND (GAUZE/BANDAGES/DRESSINGS) IMPLANT
MANIFOLD NEPTUNE II (INSTRUMENTS) ×4 IMPLANT
NDL SAFETY ECLIPSE 18X1.5 (NEEDLE) ×2 IMPLANT
NEEDLE HYPO 18GX1.5 SHARP (NEEDLE) ×2
PACK ROBOT UROLOGY CUSTOM (CUSTOM PROCEDURE TRAY) ×4 IMPLANT
RELOAD GREEN ECHELON 45 (STAPLE) ×4 IMPLANT
SET TUBE IRRIG SUCTION NO TIP (IRRIGATION / IRRIGATOR) ×4 IMPLANT
SHEET LAVH (DRAPES) IMPLANT
SOLUTION ELECTROLUBE (MISCELLANEOUS) ×4 IMPLANT
SUT ETHILON 3 0 PS 1 (SUTURE) ×4 IMPLANT
SUT MNCRL 3 0 RB1 (SUTURE) ×2 IMPLANT
SUT MNCRL 3 0 VIOLET RB1 (SUTURE) ×2 IMPLANT
SUT MNCRL AB 4-0 PS2 18 (SUTURE) ×8 IMPLANT
SUT MONOCRYL 3 0 RB1 (SUTURE) ×4
SUT VIC AB 0 CT1 27 (SUTURE) ×2
SUT VIC AB 0 CT1 27XBRD ANTBC (SUTURE) ×2 IMPLANT
SUT VIC AB 0 UR5 27 (SUTURE) ×4 IMPLANT
SUT VIC AB 2-0 SH 27 (SUTURE) ×2
SUT VIC AB 2-0 SH 27X BRD (SUTURE) ×2 IMPLANT
SUT VICRYL 0 UR6 27IN ABS (SUTURE) ×8 IMPLANT
SYR 27GX1/2 1ML LL SAFETY (SYRINGE) ×4 IMPLANT
TOWEL OR 17X26 10 PK STRL BLUE (TOWEL DISPOSABLE) ×4 IMPLANT
TOWEL OR NON WOVEN STRL DISP B (DISPOSABLE) ×4 IMPLANT
WATER STERILE IRR 1500ML POUR (IV SOLUTION) IMPLANT

## 2014-12-08 NOTE — Op Note (Signed)

## 2014-12-08 NOTE — Anesthesia Preprocedure Evaluation (Signed)
Anesthesia Evaluation  Patient identified by MRN, date of birth, ID band Patient awake    Reviewed: Allergy & Precautions, NPO status , Patient's Chart, lab work & pertinent test results  Airway Mallampati: II  TM Distance: >3 FB Neck ROM: Full    Dental no notable dental hx.    Pulmonary neg pulmonary ROS,  breath sounds clear to auscultation  Pulmonary exam normal       Cardiovascular Exercise Tolerance: Good hypertension, Pt. on medications Rhythm:Regular Rate:Normal     Neuro/Psych  Neuromuscular disease negative psych ROS   GI/Hepatic Neg liver ROS, hiatal hernia, GERD-  Medicated,  Endo/Other  negative endocrine ROS  Renal/GU Renal disease  negative genitourinary   Musculoskeletal negative musculoskeletal ROS (+)   Abdominal (+) + obese,   Peds negative pediatric ROS (+)  Hematology negative hematology ROS (+)   Anesthesia Other Findings   Reproductive/Obstetrics negative OB ROS                             Anesthesia Physical Anesthesia Plan  ASA: II  Anesthesia Plan: General   Post-op Pain Management:    Induction: Intravenous  Airway Management Planned: Oral ETT  Additional Equipment:   Intra-op Plan:   Post-operative Plan: Extubation in OR  Informed Consent: I have reviewed the patients History and Physical, chart, labs and discussed the procedure including the risks, benefits and alternatives for the proposed anesthesia with the patient or authorized representative who has indicated his/her understanding and acceptance.   Dental advisory given  Plan Discussed with: CRNA  Anesthesia Plan Comments:         Anesthesia Quick Evaluation

## 2014-12-08 NOTE — Anesthesia Postprocedure Evaluation (Signed)
  Anesthesia Post-op Note  Patient: Ernest Campos  Procedure(s) Performed: Procedure(s) (LRB): ROBOTIC ASSISTED LAPAROSCOPIC RADICAL PROSTATECTOMY LEVEL 2 (N/A) PELVIC LYMPHADENECTOMY (Bilateral)  Patient Location: PACU  Anesthesia Type: General  Level of Consciousness: awake and alert   Airway and Oxygen Therapy: Patient Spontanous Breathing  Post-op Pain: mild  Post-op Assessment: Post-op Vital signs reviewed, Patient's Cardiovascular Status Stable, Respiratory Function Stable, Patent Airway and No signs of Nausea or vomiting  Last Vitals:  Filed Vitals:   12/08/14 1655  BP: 148/95  Pulse: 76  Temp: 36.6 C  Resp: 12    Post-op Vital Signs: stable   Complications: No apparent anesthesia complications

## 2014-12-08 NOTE — Discharge Instructions (Signed)

## 2014-12-08 NOTE — Transfer of Care (Signed)
Immediate Anesthesia Transfer of Care Note  Patient: Ernest Campos  Procedure(s) Performed: Procedure(s): ROBOTIC ASSISTED LAPAROSCOPIC RADICAL PROSTATECTOMY LEVEL 2 (N/A) PELVIC LYMPHADENECTOMY (Bilateral)  Patient Location: PACU  Anesthesia Type:General  Level of Consciousness: awake, alert  and oriented  Airway & Oxygen Therapy: Patient Spontanous Breathing and Patient connected to face mask oxygen  Post-op Assessment: Report given to RN and Post -op Vital signs reviewed and stable  Post vital signs: Reviewed and stable  Last Vitals:  Filed Vitals:   12/08/14 0855  BP: 172/87  Pulse: 63  Temp: 36.7 C  Resp: 18    Complications: No apparent anesthesia complications

## 2014-12-09 ENCOUNTER — Encounter (HOSPITAL_COMMUNITY): Payer: Self-pay | Admitting: Urology

## 2014-12-09 LAB — HEMOGLOBIN AND HEMATOCRIT, BLOOD
HCT: 37 % — ABNORMAL LOW (ref 39.0–52.0)
Hemoglobin: 12.1 g/dL — ABNORMAL LOW (ref 13.0–17.0)

## 2014-12-09 MED ORDER — BISACODYL 10 MG RE SUPP
10.0000 mg | Freq: Once | RECTAL | Status: AC
Start: 2014-12-09 — End: 2014-12-09
  Administered 2014-12-09: 10 mg via RECTAL
  Filled 2014-12-09: qty 1

## 2014-12-09 MED ORDER — HYDROCODONE-ACETAMINOPHEN 5-325 MG PO TABS: 1.0000 | ORAL_TABLET | Freq: Four times a day (QID) | ORAL | Status: DC | PRN

## 2014-12-09 NOTE — Discharge Summary (Signed)
  Date of admission: 12/08/2014  Date of discharge: 12/09/2014  Admission diagnosis: Prostate Cancer  Discharge diagnosis: Prostate Cancer  History and Physical: For full details, please see admission history and physical. Briefly, Ernest Campos is a 64 y.o. gentleman with localized prostate cancer.  After discussing management/treatment options, he elected to proceed with surgical treatment.  Hospital Course: Ernest Campos was taken to the operating room on 12/08/2014 and underwent a robotic assisted laparoscopic radical prostatectomy. He tolerated this procedure well and without complications. Postoperatively, he was able to be transferred to a regular hospital room following recovery from anesthesia.  He was able to begin ambulating the night of surgery. He remained hemodynamically stable overnight.  He had excellent urine output with appropriately minimal output from his pelvic drain and his pelvic drain was removed on POD #1.  He was transitioned to oral pain medication, tolerated a clear liquid diet, and had met all discharge criteria and was able to be discharged home later on POD#1.  Laboratory values:  Recent Labs  12/08/14 1600 12/09/14 0510  HGB 13.4 12.1*  HCT 40.4 37.0*    Disposition: Home  Discharge instruction: He was instructed to be ambulatory but to refrain from heavy lifting, strenuous activity, or driving. He was instructed on urethral catheter care.  Discharge medications:     Medication List    TAKE these medications        ciprofloxacin 500 MG tablet  Commonly known as:  CIPRO  Take 1 tablet (500 mg total) by mouth 2 (two) times daily. Start day prior to office visit for foley removal     esomeprazole 40 MG capsule  Commonly known as:  NEXIUM  Take 40 mg by mouth every morning.     HYDROcodone-acetaminophen 5-325 MG per tablet  Commonly known as:  NORCO  Take 1-2 tablets by mouth every 6 (six) hours as needed.     valsartan-hydrochlorothiazide 80-12.5  MG per tablet  Commonly known as:  DIOVAN-HCT  Take 1 tablet by mouth every morning.        Followup: He will followup in 1 week for catheter removal and to discuss his surgical pathology results.

## 2014-12-09 NOTE — Care Management Note (Signed)
    Page 1 of 1   12/09/2014     2:38:02 PM CARE MANAGEMENT NOTE 12/09/2014  Patient:  Ernest Campos, Ernest Campos   Account Number:  [id number]  Date Initiated:  12/09/2014  Documentation initiated by:  Dessa Phi  Subjective/Objective Assessment:   63 y/o m admitted w/Prostate Ca.     Action/Plan:   From home.   Anticipated DC Date:  12/09/2014   Anticipated DC Plan:  [place]  CM consult      Choice offered to / List presented to:             Status of service:  Completed, signed off Medicare Important Message given?   (If response is "NO", the following Medicare IM given date fields will be blank) Date Medicare IM given:   Medicare IM given by:   Date Additional Medicare IM given:   Additional Medicare IM given by:    Discharge Disposition:  HOME/SELF CARE  Per UR Regulation:  Reviewed for med. necessity/level of care/duration of stay  If discussed at [place] of Stay Meetings, dates discussed:    Comments:  12/09/14 Dessa Phi RN BSN NCM [phone] POD#1 lap rad prostatectomy. d/c home no needs or orders.

## 2014-12-09 NOTE — Progress Notes (Signed)
1 Day Post-Op Subjective: The patient is doing well.  No nausea or vomiting. Pain is adequately controlled.  Objective: Vital signs in last 24 hours: Temp:  [97.9 F (36.6 C)-98.4 F (36.9 C)] 98.3 F (36.8 C) (03/22 0618) Pulse Rate:  [57-89] 65 (03/22 0618) Resp:  [10-18] 18 (03/22 0618) BP: (117-172)/(63-95) 117/63 mmHg (03/22 0618) SpO2:  [100 %] 100 % (03/22 0618) Weight:  [99.508 kg (219 lb 6 oz)] 99.508 kg (219 lb 6 oz) (03/21 0905)  Intake/Output from previous day: 03/21 0701 - 03/22 0700 In: 5621 [P.O.:240; I.V.:3200; IV Piggyback:1100] Out: 1495 [Urine:1250; Drains:145; Blood:100] Intake/Output this shift:    Physical Exam:  General: Alert and oriented. CV: RRR Lungs: Clear bilaterally. GI: Soft, Nondistended. Incisions: Clean, dry, and intact Urine: Clear JP: Serosanguinous Extremities: Nontender, no erythema, no edema.  Lab Results:  Recent Labs  12/08/14 1600 12/09/14 0510  HGB 13.4 12.1*  HCT 40.4 37.0*      Assessment/Plan: POD# 1 s/p robotic prostatectomy.  1) SL IVF 2) Ambulate, Incentive spirometry 3) Transition to oral pain medication 4) Dulcolax suppository 5) D/C pelvic drain 6) Plan for likely discharge later today

## 2015-05-17 IMAGING — CR DG CHEST 2V
2 series · 2 of 2 positions shown · non-contrast
Comparison: None.

CLINICAL DATA: Preop prostatectomy

EXAM:
CHEST  2 VIEW

[w chest pa]
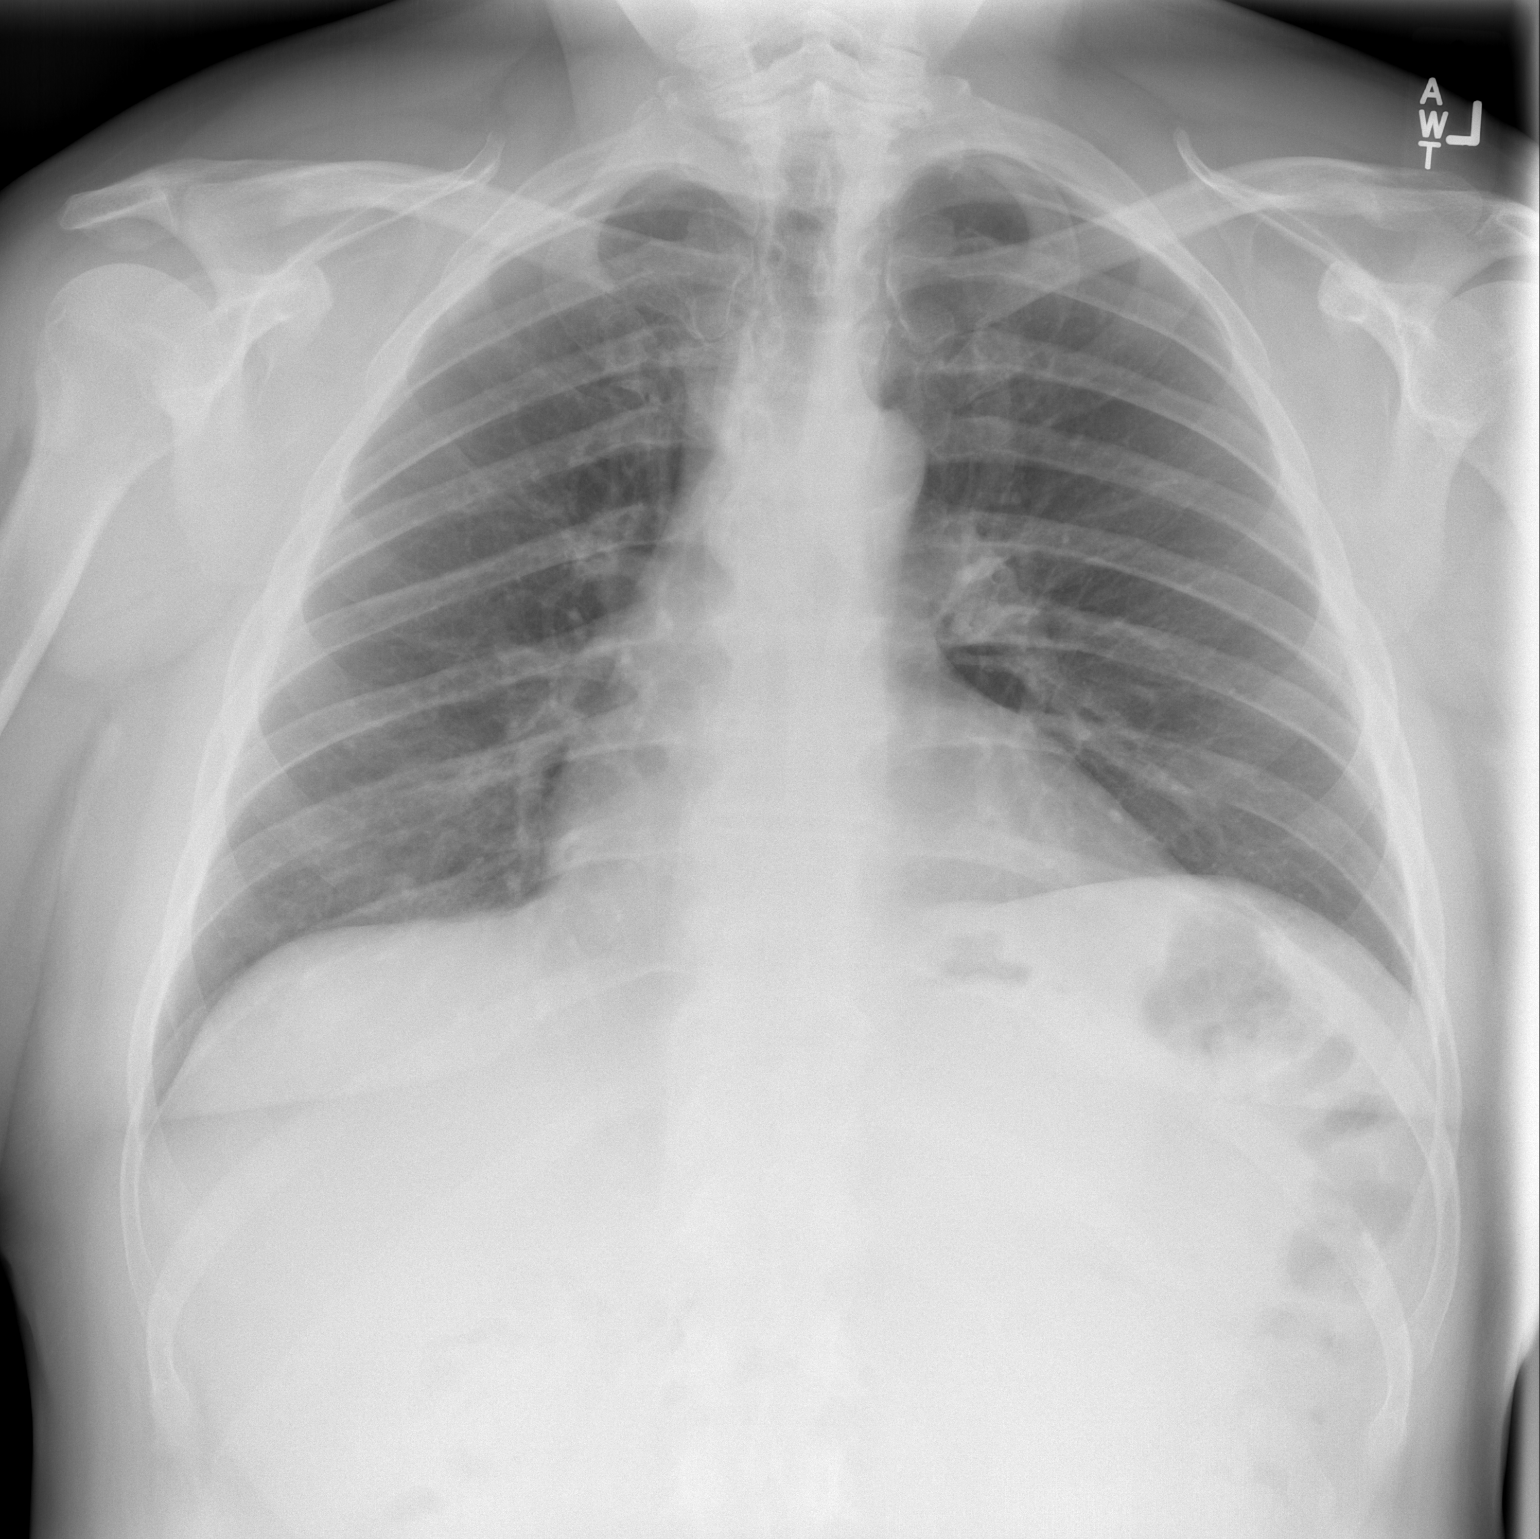

[w chest lat]
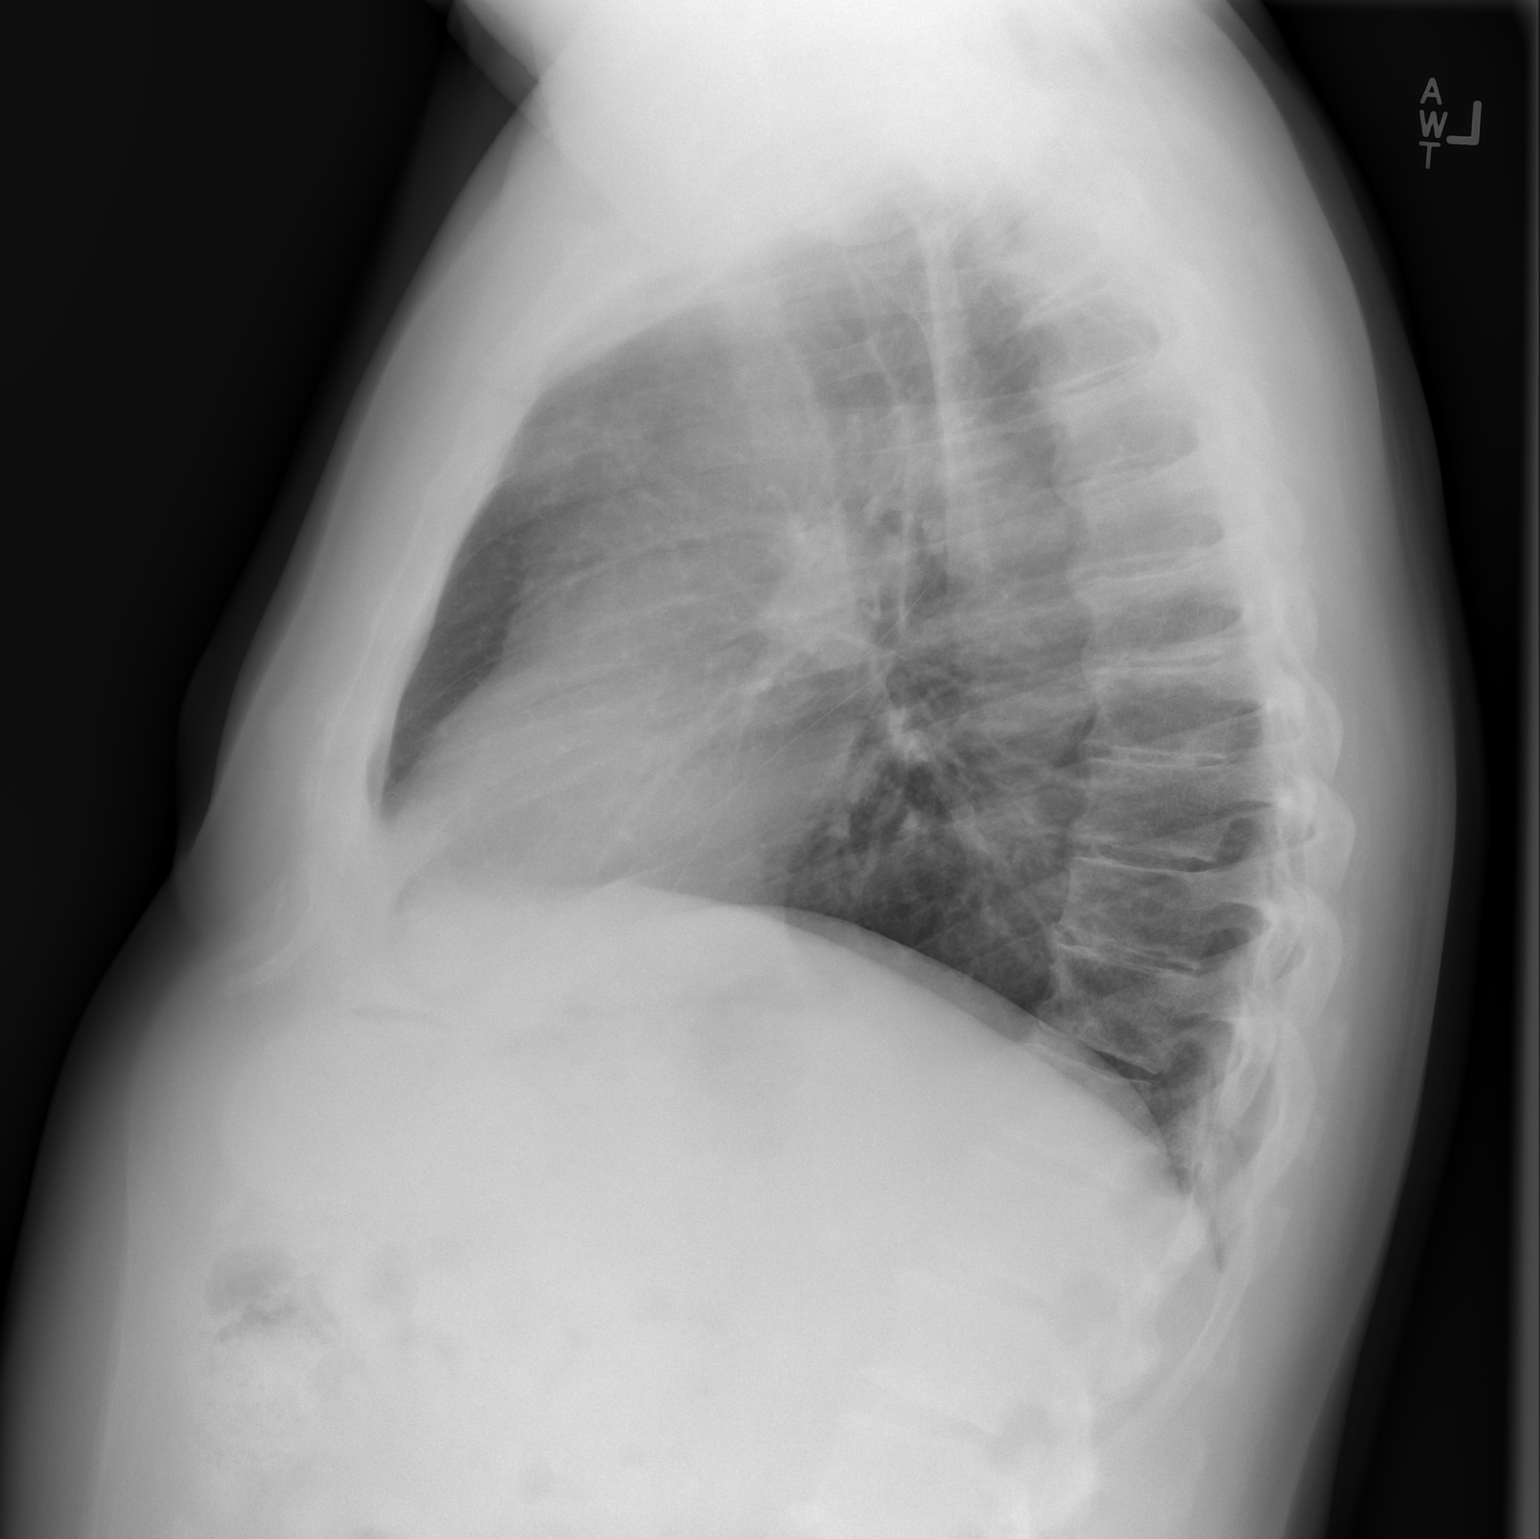

[2 of 2 positions shown; findings below may reference images not displayed]

FINDINGS: The heart size and mediastinal contours are within normal limits.
Both lungs are clear. The visualized skeletal structures are
unremarkable.
IMPRESSION: No active cardiopulmonary disease.
